# Patient Record
Sex: Male | Born: 1995 | Race: Black or African American | Hispanic: No | Marital: Single | Smoking: Never smoker
Health system: Southern US, Community
[De-identification: ages and names within clinical notes are randomized; demographics above are authoritative.]

## PROBLEM LIST (undated history)

## (undated) DIAGNOSIS — I10 Essential (primary) hypertension: Secondary | ICD-10-CM

## (undated) DIAGNOSIS — E119 Type 2 diabetes mellitus without complications: Secondary | ICD-10-CM

## (undated) DIAGNOSIS — J45909 Unspecified asthma, uncomplicated: Secondary | ICD-10-CM

## (undated) HISTORY — PX: TONSILLECTOMY: SUR1361

---

## 2018-04-22 ENCOUNTER — Emergency Department
Admission: EM | Admit: 2018-04-22 | Discharge: 2018-04-22 | Disposition: A | Discharge status: Home / Self Care | Payer: Commercial Managed Care - PPO | Attending: Emergency Medicine | Admitting: Emergency Medicine

## 2018-04-22 ENCOUNTER — Emergency Department: Payer: Commercial Managed Care - PPO

## 2018-04-22 DIAGNOSIS — R059 Cough, unspecified: Secondary | ICD-10-CM

## 2018-04-22 DIAGNOSIS — I10 Essential (primary) hypertension: Secondary | ICD-10-CM | POA: Diagnosis not present

## 2018-04-22 DIAGNOSIS — E119 Type 2 diabetes mellitus without complications: Secondary | ICD-10-CM | POA: Diagnosis not present

## 2018-04-22 DIAGNOSIS — R05 Cough: Secondary | ICD-10-CM | POA: Insufficient documentation

## 2018-04-22 DIAGNOSIS — J45909 Unspecified asthma, uncomplicated: Secondary | ICD-10-CM | POA: Insufficient documentation

## 2018-04-22 HISTORY — DX: Type 2 diabetes mellitus without complications: E11.9

## 2018-04-22 HISTORY — DX: Essential (primary) hypertension: I10

## 2018-04-22 HISTORY — DX: Unspecified asthma, uncomplicated: J45.909

## 2018-04-22 MED ORDER — ALBUTEROL SULFATE HFA 108 (90 BASE) MCG/ACT IN AERS: 2.0000 | INHALATION_SPRAY | Freq: Four times a day (QID) | RESPIRATORY_TRACT | 2 refills | Status: AC | PRN

## 2018-04-22 NOTE — ED Provider Notes (Signed)
[facility] Emergency Department Provider Note  ____________________________________________   I have reviewed the triage vital signs and the nursing notes. Where available I have reviewed prior notes and, if possible and indicated, outside hospital notes.    HISTORY  Chief Complaint Cough    HPI Spencer Kelly is a 22 y.o. male 2 diabetes which is well controlled, morbid obesity, he states he has been having a cough for the last week.  Is occasionally productive.  He states the cough is actually getting much better but today he noticed that there was a slight pink tint to the mucus he was coughing up and that was concerning to him.  He is not short of breath, he has no leg swelling, he states he has positive sick contacts with similar.  He has had no fevers, he does not feel weak or otherwise unwell but he was concerned that there was a slight pink cast to his sputum.  He denies any other complaints.  It is his strong feeling that the cough is getting better.  No recent travel, no leg swelling no history of PE or DVT.    Past Medical History:  Diagnosis Date  . Asthma   . Diabetes mellitus without complication (HCC)   . Hypertension     There are no active problems to display for this patient.   Past Surgical History:  Procedure Laterality Date  . TONSILLECTOMY      Prior to Admission medications   Not on File    Allergies Patient has no known allergies.  No family history on file.  Social History Social History   Tobacco Use  . Smoking status: Never Smoker  . Smokeless tobacco: Never Used  Substance Use Topics  . Alcohol use: Yes  . Drug use: Never    Review of Systems Constitutional: No fever/chills Eyes: No visual changes. ENT: No sore throat. No stiff neck no neck pain Cardiovascular: Denies chest pain. Respiratory: Denies shortness of breath. Gastrointestinal:   no vomiting.  No diarrhea.  No constipation. Genitourinary:  Negative for dysuria. Musculoskeletal: Negative lower extremity swelling Skin: Negative for rash. Neurological: Negative for severe headaches, focal weakness or numbness.   ____________________________________________   PHYSICAL EXAM:  VITAL SIGNS: ED Triage Vitals [04/22/18 0242]  Enc Vitals Group     BP (!) 144/72     Pulse Rate (!) 104     Resp 17     Temp 98.6 F (37 C)     Temp Source Oral     SpO2 100 %     Weight (!) 410 lb (186 kg)     Height 6' (1.829 m)     Head Circumference      Peak Flow      Pain Score 0     Pain Loc      Pain Edu?      Excl. in GC?     Constitutional: Alert and oriented. Well appearing and in no acute distress. Eyes: Conjunctivae are normal Head: Atraumatic HEENT: No congestion/rhinnorhea. Mucous membranes are moist.  Oropharynx non-erythematous Neck:   Nontender with no meningismus, no masses, no stridor Cardiovascular: Normal rate, regular rhythm. Grossly normal heart sounds.  Good peripheral circulation. Respiratory: Normal respiratory effort.  No retractions. Lungs CTAB. Abdominal: Soft and nontender. No distention. No guarding no rebound Back:  There is no focal tenderness or step off.  there is no midline tenderness there are no lesions noted. there is no CVA tenderness Musculoskeletal: No  lower extremity tenderness, no upper extremity tenderness. No joint effusions, no DVT signs strong distal pulses no edema Neurologic:  Normal speech and language. No gross focal neurologic deficits are appreciated.  Skin:  Skin is warm, dry and intact. No rash noted. Psychiatric: Mood and affect are normal. Speech and behavior are normal.  ____________________________________________   LABS (all labs ordered are listed, but only abnormal results are displayed)  Labs Reviewed - No data to display  Pertinent labs  results that were available during my care of the patient were reviewed by me and considered in my medical decision making (see  chart for details). ____________________________________________  EKG  I personally interpreted any EKGs ordered by me or triage  ____________________________________________  RADIOLOGY  Pertinent labs & imaging results that were available during my care of the patient were reviewed by me and considered in my medical decision making (see chart for details). If possible, patient and/or family made aware of any abnormal findings.  Dg Chest 2 View  Result Date: 04/22/2018 CLINICAL DATA:  Productive cough for 1 week. EXAM: CHEST - 2 VIEW COMPARISON:  None. FINDINGS: The heart size and mediastinal contours are within normal limits. Both lungs are clear. The visualized skeletal structures are unremarkable. IMPRESSION: No active cardiopulmonary disease. Electronically Signed   By: Burman NievesWilliam  Stevens M.D.   On: 04/22/2018 03:09   ____________________________________________    PROCEDURES  Procedure(s) performed: None  Procedures  Critical Care performed: None  ____________________________________________   INITIAL IMPRESSION / ASSESSMENT AND PLAN / ED COURSE  Pertinent labs & imaging results that were available during my care of the patient were reviewed by me and considered in my medical decision making (see chart for details).  Patient here with a cough which is rapidly improving but there was a slight amount of pink sputum and he was concerned about that.  He is very well-appearing chest x-ray is clear lungs are clear no respiratory distress, he appears to have a resolving viral issue.  I will send him home with albuterol to see if it helps his cough but at this time I do not see an indication for antibiotics, I do not think he has a PE ACS dissection myocarditis endocarditis, TB or any other significant intrathoracic pathology.  Heart rate is very slightly elevated, which I do not think is significant.  Patient has a productive cough which is improving.     ____________________________________________   FINAL CLINICAL IMPRESSION(S) / ED DIAGNOSES  Final diagnoses:  None      This chart was dictated using voice recognition software.  Despite best efforts to proofread,  errors can occur which can change meaning.      Jeanmarie PlantMcShane, Raeleen Winstanley A, MD 04/22/18 [phone]

## 2018-04-22 NOTE — ED Triage Notes (Signed)
Patient c/o productive cough X 1 week.

## 2018-04-22 NOTE — Discharge Instructions (Signed)
Return to the emergency room for any new or worrisome symptoms including significant shortness of breath, fever, chest pain, if you bleed a significant amount or you have any other new or worrisome symptoms as discussed.

## 2018-11-13 ENCOUNTER — Other Ambulatory Visit: Payer: Self-pay

## 2018-11-13 ENCOUNTER — Emergency Department
Admission: EM | Admit: 2018-11-13 | Discharge: 2018-11-13 | Disposition: A | Discharge status: Home / Self Care | Payer: Commercial Managed Care - PPO | Source: Home / Self Care | Attending: Emergency Medicine | Admitting: Emergency Medicine

## 2018-11-13 ENCOUNTER — Encounter: Payer: Self-pay | Admitting: Emergency Medicine

## 2018-11-13 ENCOUNTER — Emergency Department
Admission: EM | Admit: 2018-11-13 | Discharge: 2018-11-13 | Disposition: A | Discharge status: Home / Self Care | Payer: Commercial Managed Care - PPO | Attending: Emergency Medicine | Admitting: Emergency Medicine

## 2018-11-13 DIAGNOSIS — I10 Essential (primary) hypertension: Secondary | ICD-10-CM

## 2018-11-13 DIAGNOSIS — Z5321 Procedure and treatment not carried out due to patient leaving prior to being seen by health care provider: Secondary | ICD-10-CM | POA: Diagnosis not present

## 2018-11-13 DIAGNOSIS — J069 Acute upper respiratory infection, unspecified: Secondary | ICD-10-CM | POA: Insufficient documentation

## 2018-11-13 DIAGNOSIS — Z79899 Other long term (current) drug therapy: Secondary | ICD-10-CM

## 2018-11-13 DIAGNOSIS — E119 Type 2 diabetes mellitus without complications: Secondary | ICD-10-CM

## 2018-11-13 DIAGNOSIS — J45909 Unspecified asthma, uncomplicated: Secondary | ICD-10-CM

## 2018-11-13 DIAGNOSIS — B9789 Other viral agents as the cause of diseases classified elsewhere: Principal | ICD-10-CM

## 2018-11-13 DIAGNOSIS — R05 Cough: Secondary | ICD-10-CM | POA: Diagnosis not present

## 2018-11-13 MED ORDER — FLUTICASONE PROPIONATE 50 MCG/ACT NA SUSP: 2.0000 | Freq: Every day | NASAL | 0 refills | Status: AC

## 2018-11-13 MED ORDER — PREDNISONE 20 MG PO TABS: 20.0000 mg | ORAL_TABLET | Freq: Two times a day (BID) | ORAL | 0 refills | Status: AC

## 2018-11-13 MED ORDER — BENZONATATE 100 MG PO CAPS: ORAL_CAPSULE | ORAL | 0 refills | Status: AC

## 2018-11-13 NOTE — ED Notes (Signed)
Patient reports cough x 4 days that is not better with otc medications.  Patient denies fever.  Patient states he has chest pain with coughing.

## 2018-11-13 NOTE — ED Triage Notes (Signed)
Pt c/o dry cough x3 days. Pt sts, "I know there is a lot of talk about coronavirus and I just flew from [place] to [place] and [place] in January. I just want to make sure I don't have anything and that I stay ahead of this." Pt took OTC medications without relief. Pt denies other symptoms other than cough.

## 2018-11-13 NOTE — Discharge Instructions (Addendum)
Your exam is consistent with a likely viral or allergic cough (bronchospasm). Take the prescription meds as directed. Start OTC Delsym for additional cough relief. Use your rescue inhaler as needed. Follow-up with [facility] for routine medical care, or return as needed.

## 2018-11-13 NOTE — ED Triage Notes (Signed)
Patient ambulatory to triage with steady gait, without difficulty or distress noted, mask in place; pt reports occas prod cough yellow sputum x 4 days; here last night but left prior to being seen due to long wait; denies fever

## 2018-11-14 NOTE — ED Provider Notes (Signed)
[facility] Emergency Department Provider Note ____________________________________________  Time seen: 2109  I have reviewed the triage vital signs and the nursing notes.  HISTORY  Chief Complaint  Cough  HPI Spencer Kelly is a 23 y.o. male presents himself to the ED for evaluation of an occasional productive cough with intermittently productive yellow sputum over the last 4 days.  Patient initially presented last night but left secondary to the long wait.  He denies any interim fevers, chills, or sweats.  He has been taking TheraFlu over-the-counter without significant benefit.  He denies any frank fevers, sick contacts, or recent travel.  Patient also denies any significant chest pain or shortness of breath.  Past Medical History:  Diagnosis Date  . Asthma   . Diabetes mellitus without complication (HCC)   . Hypertension     There are no active problems to display for this patient.   Past Surgical History:  Procedure Laterality Date  . TONSILLECTOMY      Prior to Admission medications   Medication Sig Start Date End Date Taking? Authorizing Provider  albuterol (PROVENTIL HFA;VENTOLIN HFA) 108 (90 Base) MCG/ACT inhaler Inhale 2 puffs into the lungs every 6 (six) hours as needed for wheezing or shortness of breath. 04/22/18   Jeanmarie Plant, MD  benzonatate (TESSALON PERLES) 100 MG capsule Take 1-2 tabs TID prn cough 11/13/18   Jimmylee Ratterree, Charlesetta Ivory, PA-C  fluticasone (FLONASE) 50 MCG/ACT nasal spray Place 2 sprays into both nostrils daily. 11/13/18   Laconda Basich, Charlesetta Ivory, PA-C  predniSONE (DELTASONE) 20 MG tablet Take 1 tablet (20 mg total) by mouth 2 (two) times daily with a meal for 5 days. 11/13/18 11/18/18  Salim Forero, Charlesetta Ivory, PA-C    Allergies Patient has no known allergies.  No family history on file.  Social History Social History   Tobacco Use  . Smoking status: Never Smoker  . Smokeless tobacco: Never Used  Substance Use Topics   . Alcohol use: Yes  . Drug use: Never    Review of Systems  Constitutional: Negative for fever. Eyes: Negative for visual changes. ENT: Negative for sore throat. Cardiovascular: Negative for chest pain. Respiratory: Negative for shortness of breath.  Reports intermittent cough as above. Gastrointestinal: Negative for abdominal pain, vomiting and diarrhea. Genitourinary: Negative for dysuria. Musculoskeletal: Negative for back pain. Skin: Negative for rash. Neurological: Negative for headaches, focal weakness or numbness. ____________________________________________  PHYSICAL EXAM:  VITAL SIGNS: ED Triage Vitals  Enc Vitals Group     BP 11/13/18 2004 (!) 143/90     Pulse Rate 11/13/18 2004 100     Resp 11/13/18 2004 18     Temp 11/13/18 2004 98.5 F (36.9 C)     Temp Source 11/13/18 2004 Oral     SpO2 11/13/18 2004 98 %     Weight 11/13/18 2002 (!) 450 lb (204.1 kg)     Height 11/13/18 2002 6' (1.829 m)     Head Circumference --      Peak Flow --      Pain Score 11/13/18 2002 0     Pain Loc --      Pain Edu? --      Excl. in GC? --     Constitutional: Alert and oriented. Well appearing and in no distress. Head: Normocephalic and atraumatic. Eyes: Conjunctivae are normal. Normal extraocular movements Ears: Canals clear. TMs intact bilaterally. Nose: No congestion/rhinorrhea/epistaxis. Mouth/Throat: Mucous membranes are moist.  No oral lesions appreciated. Cardiovascular: Normal  rate, regular rhythm. Normal distal pulses. Respiratory: Normal respiratory effort. No wheezes/rales/rhonchi. Musculoskeletal: Nontender with normal range of motion in all extremities.  Neurologic:  Normal gait without ataxia. Normal speech and language. No gross focal neurologic deficits are appreciated. Skin:  Skin is warm, dry and intact. No rash noted. ____________________________________________  PROCEDURES  Procedures ____________________________________________  INITIAL  IMPRESSION / ASSESSMENT AND PLAN / ED COURSE  Patient presents to the ED for 4-day complaint of intermittent cough and congestion.  Patient is been afebrile and intact without any significant shortness of breath or chest pain.  His clinical picture likely represents an acute viral etiology and perhaps a mild allergic component.  He is without any signs of acute respiratory distress or toxicity.  Exam is reassuring at this time.  Patient is agreeable to the treatment plan to manage symptomatically with Tessalon Perles, Flonase, and prednisone.  He will follow-up with the local community clinic or return to the ED as needed.  He has been referred to the [facility] for routine primary care.  Return precautions have been reviewed. ___________________________________________  FINAL CLINICAL IMPRESSION(S) / ED DIAGNOSES  Final diagnoses:  Viral URI with cough      Laneta Guerin, Charlesetta Ivory, PA-C 11/14/18 0017    Phineas Semen, MD 11/14/18 [phone]

## 2019-05-24 IMAGING — CR DG CHEST 2V
1 series · 2 of 2 positions shown · non-contrast
Comparison: None.

CLINICAL DATA: Productive cough for 1 week.

EXAM:
CHEST - 2 VIEW

[Series 1: dg chest 2 view · 0.14mm/px · 2 of 2 slices shown]
[im 1/2]
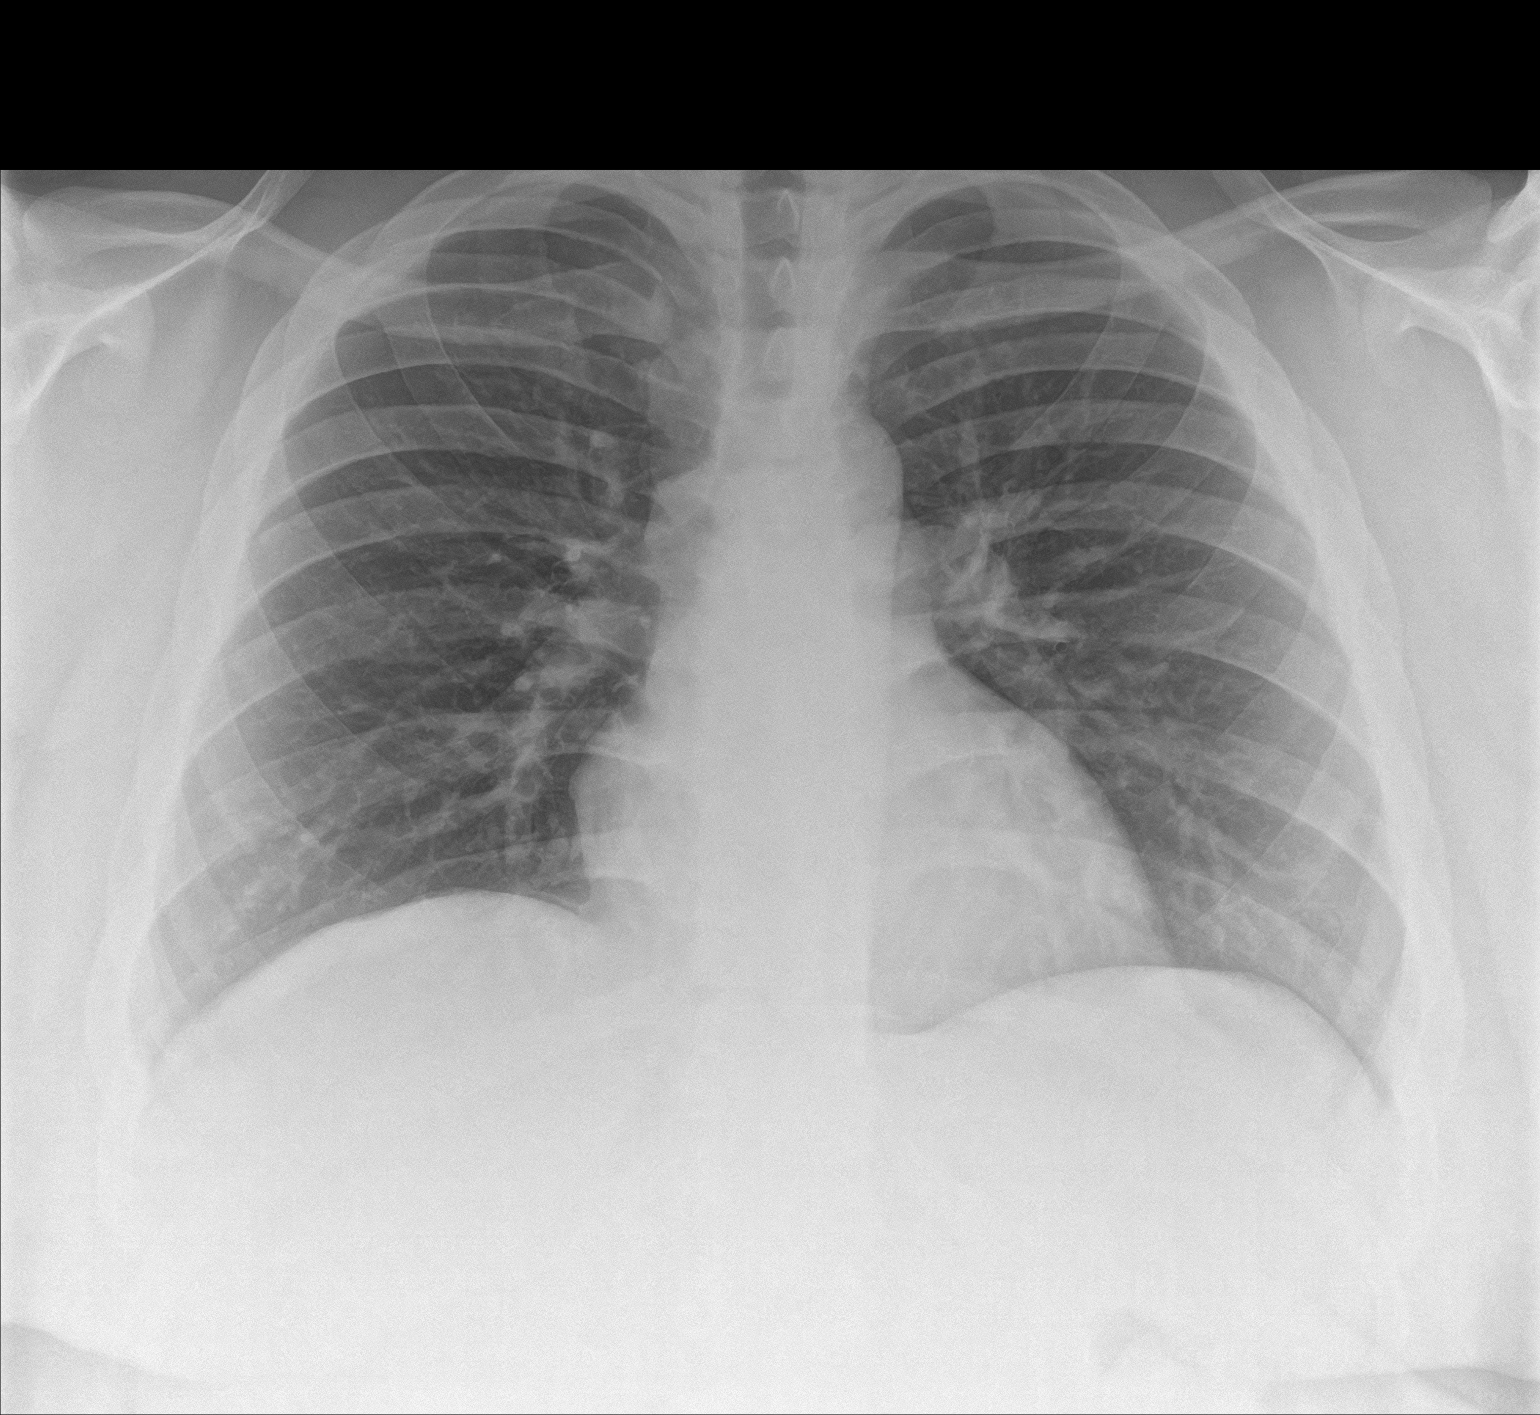
[im 2/2]
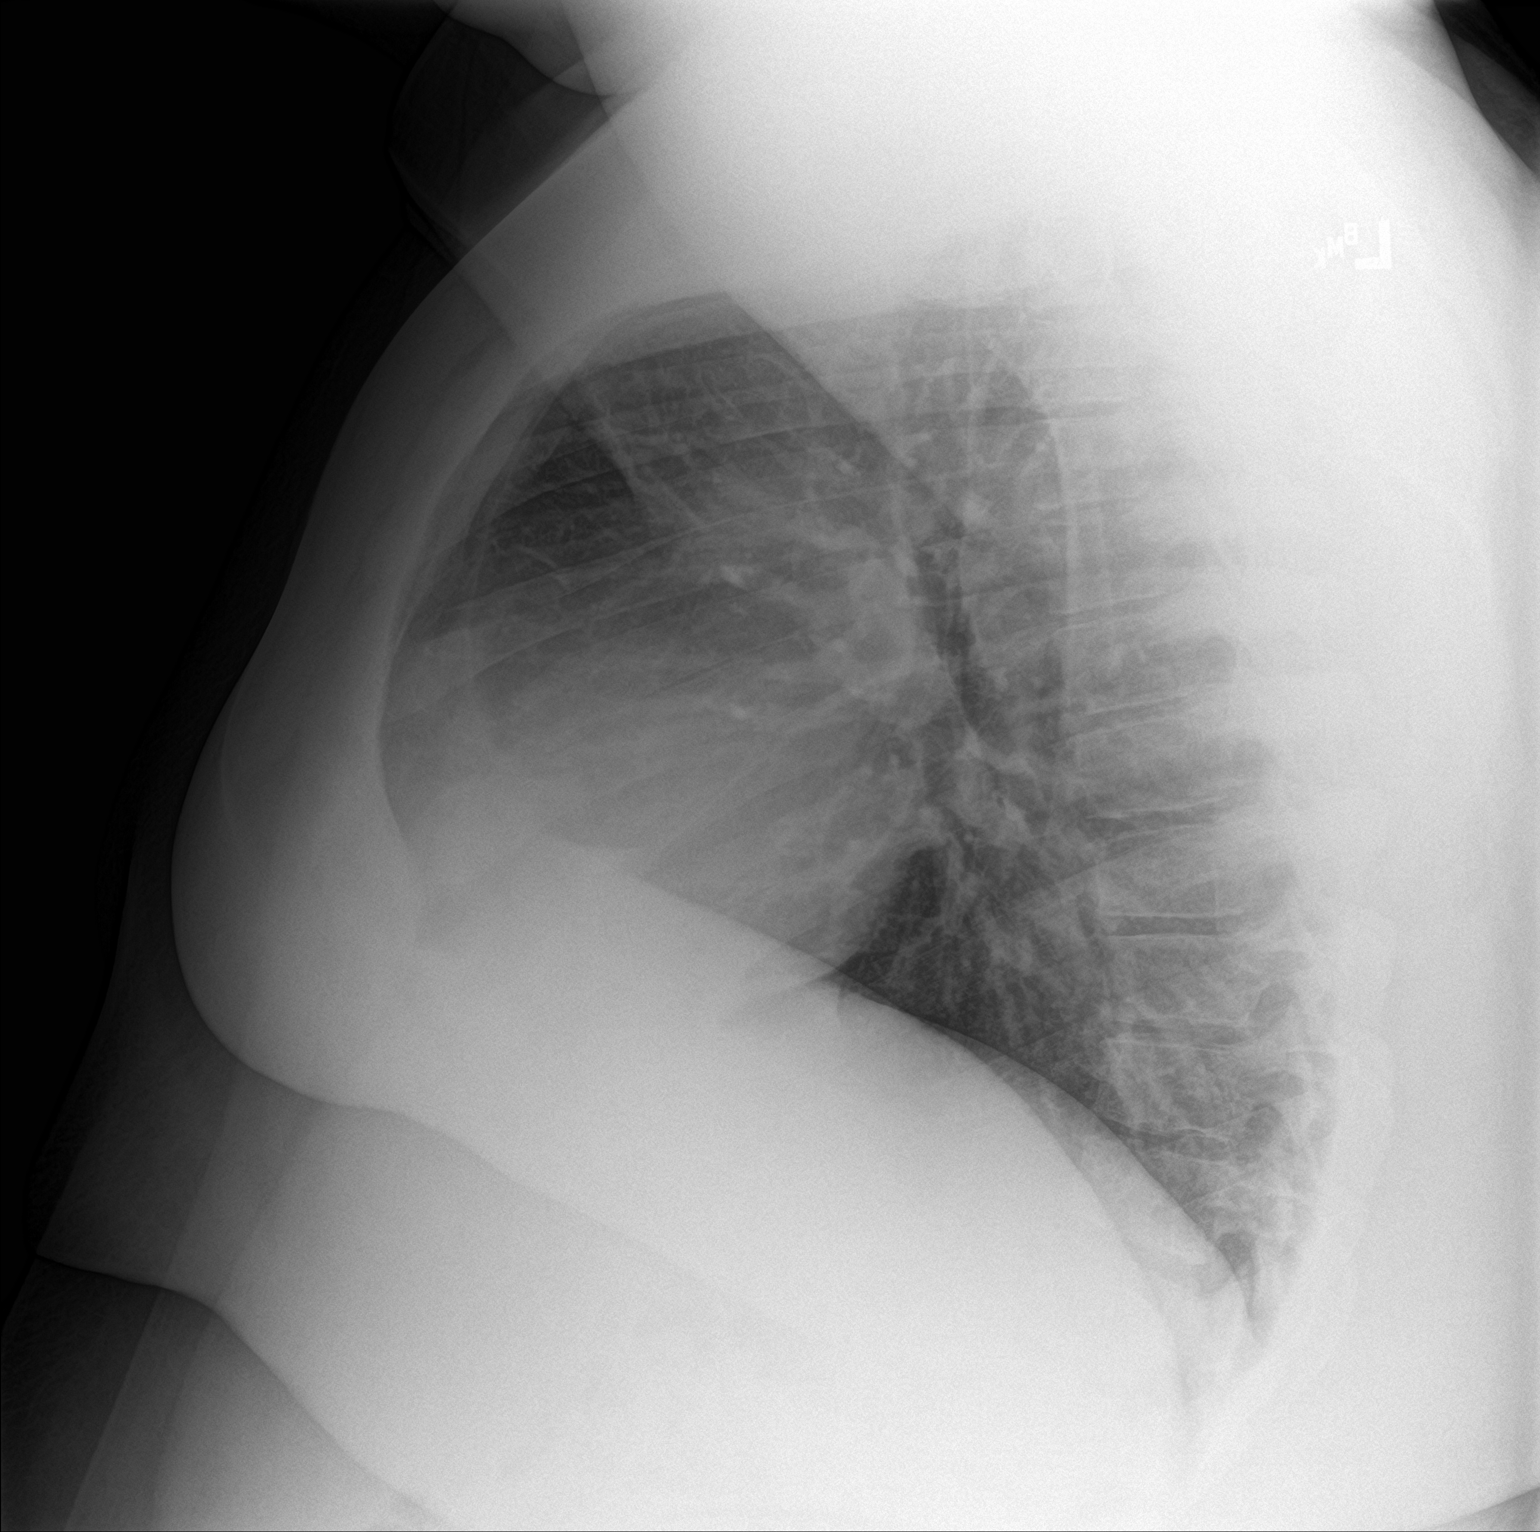

[2 of 2 positions shown; findings below may reference images not displayed]

FINDINGS: The heart size and mediastinal contours are within normal limits.
Both lungs are clear. The visualized skeletal structures are
unremarkable.
IMPRESSION: No active cardiopulmonary disease.
